# Patient Record
Sex: Male | Born: 1945 | Race: White | Hispanic: No | Marital: Married | Smoking: Never smoker
Health system: Southern US, Community
[De-identification: ages and names within clinical notes are randomized; demographics above are authoritative.]

## PROBLEM LIST (undated history)

## (undated) DIAGNOSIS — I10 Essential (primary) hypertension: Secondary | ICD-10-CM

---

## 2018-04-13 ENCOUNTER — Emergency Department (HOSPITAL_COMMUNITY)
Admission: EM | Admit: 2018-04-13 | Discharge: 2018-04-13 | Disposition: A | Discharge status: Home / Self Care | Payer: No Typology Code available for payment source | Attending: Emergency Medicine | Admitting: Emergency Medicine

## 2018-04-13 ENCOUNTER — Emergency Department (HOSPITAL_COMMUNITY): Payer: No Typology Code available for payment source

## 2018-04-13 ENCOUNTER — Encounter (HOSPITAL_COMMUNITY): Payer: Self-pay | Admitting: Emergency Medicine

## 2018-04-13 ENCOUNTER — Other Ambulatory Visit: Payer: Self-pay

## 2018-04-13 DIAGNOSIS — R0789 Other chest pain: Secondary | ICD-10-CM | POA: Diagnosis not present

## 2018-04-13 DIAGNOSIS — Z79899 Other long term (current) drug therapy: Secondary | ICD-10-CM | POA: Diagnosis not present

## 2018-04-13 DIAGNOSIS — I10 Essential (primary) hypertension: Secondary | ICD-10-CM | POA: Diagnosis not present

## 2018-04-13 DIAGNOSIS — R079 Chest pain, unspecified: Secondary | ICD-10-CM

## 2018-04-13 DIAGNOSIS — Z7982 Long term (current) use of aspirin: Secondary | ICD-10-CM | POA: Insufficient documentation

## 2018-04-13 DIAGNOSIS — M25512 Pain in left shoulder: Secondary | ICD-10-CM | POA: Diagnosis not present

## 2018-04-13 HISTORY — DX: Essential (primary) hypertension: I10

## 2018-04-13 LAB — CBC
HEMATOCRIT: 44.2 % (ref 39.0–52.0)
HEMOGLOBIN: 15.5 g/dL (ref 13.0–17.0)
MCH: 34.6 pg — AB (ref 26.0–34.0)
MCHC: 35.1 g/dL (ref 30.0–36.0)
MCV: 98.7 fL (ref 78.0–100.0)
Platelets: 307 10*3/uL (ref 150–400)
RBC: 4.48 MIL/uL (ref 4.22–5.81)
RDW: 13.9 % (ref 11.5–15.5)
WBC: 16.4 10*3/uL — ABNORMAL HIGH (ref 4.0–10.5)

## 2018-04-13 LAB — BASIC METABOLIC PANEL
ANION GAP: 8 (ref 5–15)
BUN: 12 mg/dL (ref 8–23)
CHLORIDE: 104 mmol/L (ref 98–111)
CO2: 26 mmol/L (ref 22–32)
Calcium: 9.9 mg/dL (ref 8.9–10.3)
Creatinine, Ser: 0.88 mg/dL (ref 0.61–1.24)
GFR calc non Af Amer: >60 mL/min (ref >60)
GLUCOSE: 121 mg/dL — AB (ref 70–99)
POTASSIUM: 3.6 mmol/L (ref 3.5–5.1)
Sodium: 138 mmol/L (ref 135–145)

## 2018-04-13 LAB — TROPONIN I

## 2018-04-13 MED ORDER — HYDROCODONE-ACETAMINOPHEN 5-325 MG PO TABS
1.0000 | ORAL_TABLET | Freq: Once | ORAL | Status: AC
  Administered 2018-04-13: 1 via ORAL
  Filled 2018-04-13: qty 1

## 2018-04-13 MED ORDER — HYDROCODONE-ACETAMINOPHEN 5-325 MG PO TABS: 1.0000 | ORAL_TABLET | Freq: Four times a day (QID) | ORAL | 0 refills | Status: AC | PRN

## 2018-04-13 NOTE — ED Triage Notes (Signed)
Chest pains for 5 days worse at night and goes from under his left arm to the center of his chest

## 2018-04-13 NOTE — ED Provider Notes (Signed)
Emergency Department Provider Note   I have reviewed the triage vital signs and the nursing notes.   HISTORY  Chief Complaint Chest Pain   HPI Mitchell Weber is a 72 y.o. male with history of hypertension presents the emergency department today secondary to chest pain.  Patient states he had about 5 to 6 days of progressively worsening chest pain that radiates from his left axilla to his left chest and left shoulder.  Is not worse with exertion.  Has no associated symptoms.  [place] seen in [place] Friday and was admitted for overnight observation where he had a CT scan was negative for PE, 2- troponins, and assuming a negative EKG as well.  He called his doctor to get a follow-up appointment as it seems to be worsening and is very persistent especially at night however they sent him to the emergency room instead.  Patient has no associated symptoms such as shortness of breath, nausea, vomiting, diarrhea, lightheadedness.  He will have very mild sweating intermittently at night but not consistently.  No lower extremity swelling.  No rashes.  No trauma.  Pain is not made worse or better with breathing or position changes. No other associated or modifying symptoms.    Past Medical History:  Diagnosis Date  . Hypertension     There are no active problems to display for this patient.   History reviewed. No pertinent surgical history.  Current Outpatient Rx  . Order #: [id number] Class: Historical Med  . Order #: [id number] Class: Historical Med  . Order #: [id number] Class: Historical Med  . Order #: [id number] Class: Historical Med  . Order #: [id number] Class: Historical Med  . Order #: [id number] Class: Historical Med  . Order #: [id number] Class: Historical Med  . Order #: [id number] Class: Historical Med  . Order #: [id number] Class: Print    Allergies Patient has no known allergies.  No family history on file.  Social History Social History   Tobacco  Use  . Smoking status: Not on file  Substance Use Topics  . Alcohol use: Not on file  . Drug use: Not on file    Review of Systems  All other systems negative except as documented in the HPI. All pertinent positives and negatives as reviewed in the HPI. ____________________________________________   PHYSICAL EXAM:  VITAL SIGNS: ED Triage Vitals  Enc Vitals Group     BP 04/13/18 0905 (!) 162/92     Pulse Rate 04/13/18 0905 82     Resp 04/13/18 0905 (!) 23     Temp 04/13/18 0905 97.9 F (36.6 C)     Temp Source 04/13/18 0905 Oral     SpO2 04/13/18 0905 98 %     Weight 04/13/18 0859 185 lb (83.9 kg)     Height 04/13/18 0859 5\' 7"  (1.702 m)    Constitutional: Alert and oriented. Well appearing and in no acute distress. Eyes: Conjunctivae are normal. PERRL. EOMI. Head: Atraumatic. Nose: No congestion/rhinnorhea. Mouth/Throat: Mucous membranes are moist.  Oropharynx non-erythematous. Neck: No stridor.  No meningeal signs.   Cardiovascular: Normal rate, regular rhythm. Good peripheral circulation. Grossly normal heart sounds.   Respiratory: Normal respiratory effort.  No retractions. Lungs CTAB. Gastrointestinal: Soft and nontender. No distention.  Musculoskeletal: No lower extremity tenderness nor edema. No gross deformities of extremities. Mild ttp in left lateral ribs but doesn't reproduce his pain. Neurologic:  Normal speech and language. Right facial droop. Skin:  Skin is warm, dry and intact. No rash noted.  ____________________________________________   LABS (all labs ordered are listed, but only abnormal results are displayed)  Labs Reviewed  BASIC METABOLIC PANEL - Abnormal; Notable for the following components:      Result Value   Glucose, Bld 121 (*)    All other components within normal limits  CBC - Abnormal; Notable for the following components:   WBC 16.4 (*)    MCH 34.6 (*)    All other components within normal limits  TROPONIN I    ____________________________________________  EKG   EKG Interpretation  Date/Time:  Tuesday April 13 2018 08:58:11 EDT Ventricular Rate:  81 PR Interval:    QRS Duration: 85 QT Interval:  376 QTC Calculation: 437 R Axis:   -17 Text Interpretation:  Sinus rhythm Borderline left axis deviation No old tracing to compare Confirmed by Marily Memos [phone]) on 04/13/2018 9:10:34 AM       ____________________________________________  RADIOLOGY  Dg Chest 2 View  Result Date: 04/13/2018 CLINICAL DATA:  Constant chest pain for the past 5-6 days. History of hypertension. EXAM: CHEST - 2 VIEW COMPARISON:  None in PACs FINDINGS: The lungs are adequately inflated. There is no focal infiltrate. There is no pleural effusion. The heart and pulmonary vascularity are normal. The mediastinum is normal in width. There is calcification in the wall of the aortic arch. 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 IMPRESSION: There is no acute cardiopulmonary abnormality. Thoracic aortic atherosclerosis. Electronically Signed   By: David  [place] M.D.   On: 04/13/2018 09:39    ____________________________________________   PROCEDURES  Procedure(s) performed:   Procedures   ____________________________________________   INITIAL IMPRESSION / ASSESSMENT AND PLAN / ED COURSE  Unclear etiology for the patient's pain.  Will give a dose of pain medicine and talk to cardiology.  EKG, troponin and chest x-ray here are within normal limits.  His white blood cell counts a little bit elevated of which I am not sure what to make of it however I do not proceed or be any emergent causes for symptoms.  Will disposition based on cardiology recommendations.   [facility] but is more than willing to see them if they want to.  After discussion with the patient he will [facility] to [facility]     Pertinent labs & imaging results that were available during my care of the patient were reviewed by me and considered in my medical decision making (see chart for details).  ____________________________________________  FINAL CLINICAL IMPRESSION(S) / ED DIAGNOSES  Final diagnoses:  Nonspecific chest pain     MEDICATIONS GIVEN DURING THIS VISIT:  Medications  HYDROcodone-acetaminophen (NORCO/VICODIN) 5-325 MG per tablet 1 tablet (1 tablet Oral Given 04/13/18 0956)     NEW OUTPATIENT MEDICATIONS STARTED DURING THIS VISIT:  Discharge Medication List as of 04/13/2018 12:15 PM    START taking these medications   Details  HYDROcodone-acetaminophen (NORCO/VICODIN) 5-325 MG tablet Take 1-2 tablets by mouth every 6 (six) hours as needed., Starting Tue 04/13/2018, Print        Note:  This note was prepared with assistance of Dragon voice recognition software. Occasional wrong-word or sound-a-like substitutions may have occurred due to the inherent limitations of voice recognition software.   Marily Memos, MD 04/13/18 [phone]

## 2019-03-04 IMAGING — DX DG CHEST 2V
2 series · 2 of 2 positions shown · non-contrast
Comparison: None in PACs

CLINICAL DATA: Constant chest pain for the past 5-6 days. History
of hypertension.

EXAM:
CHEST - 2 VIEW

[chest pa]
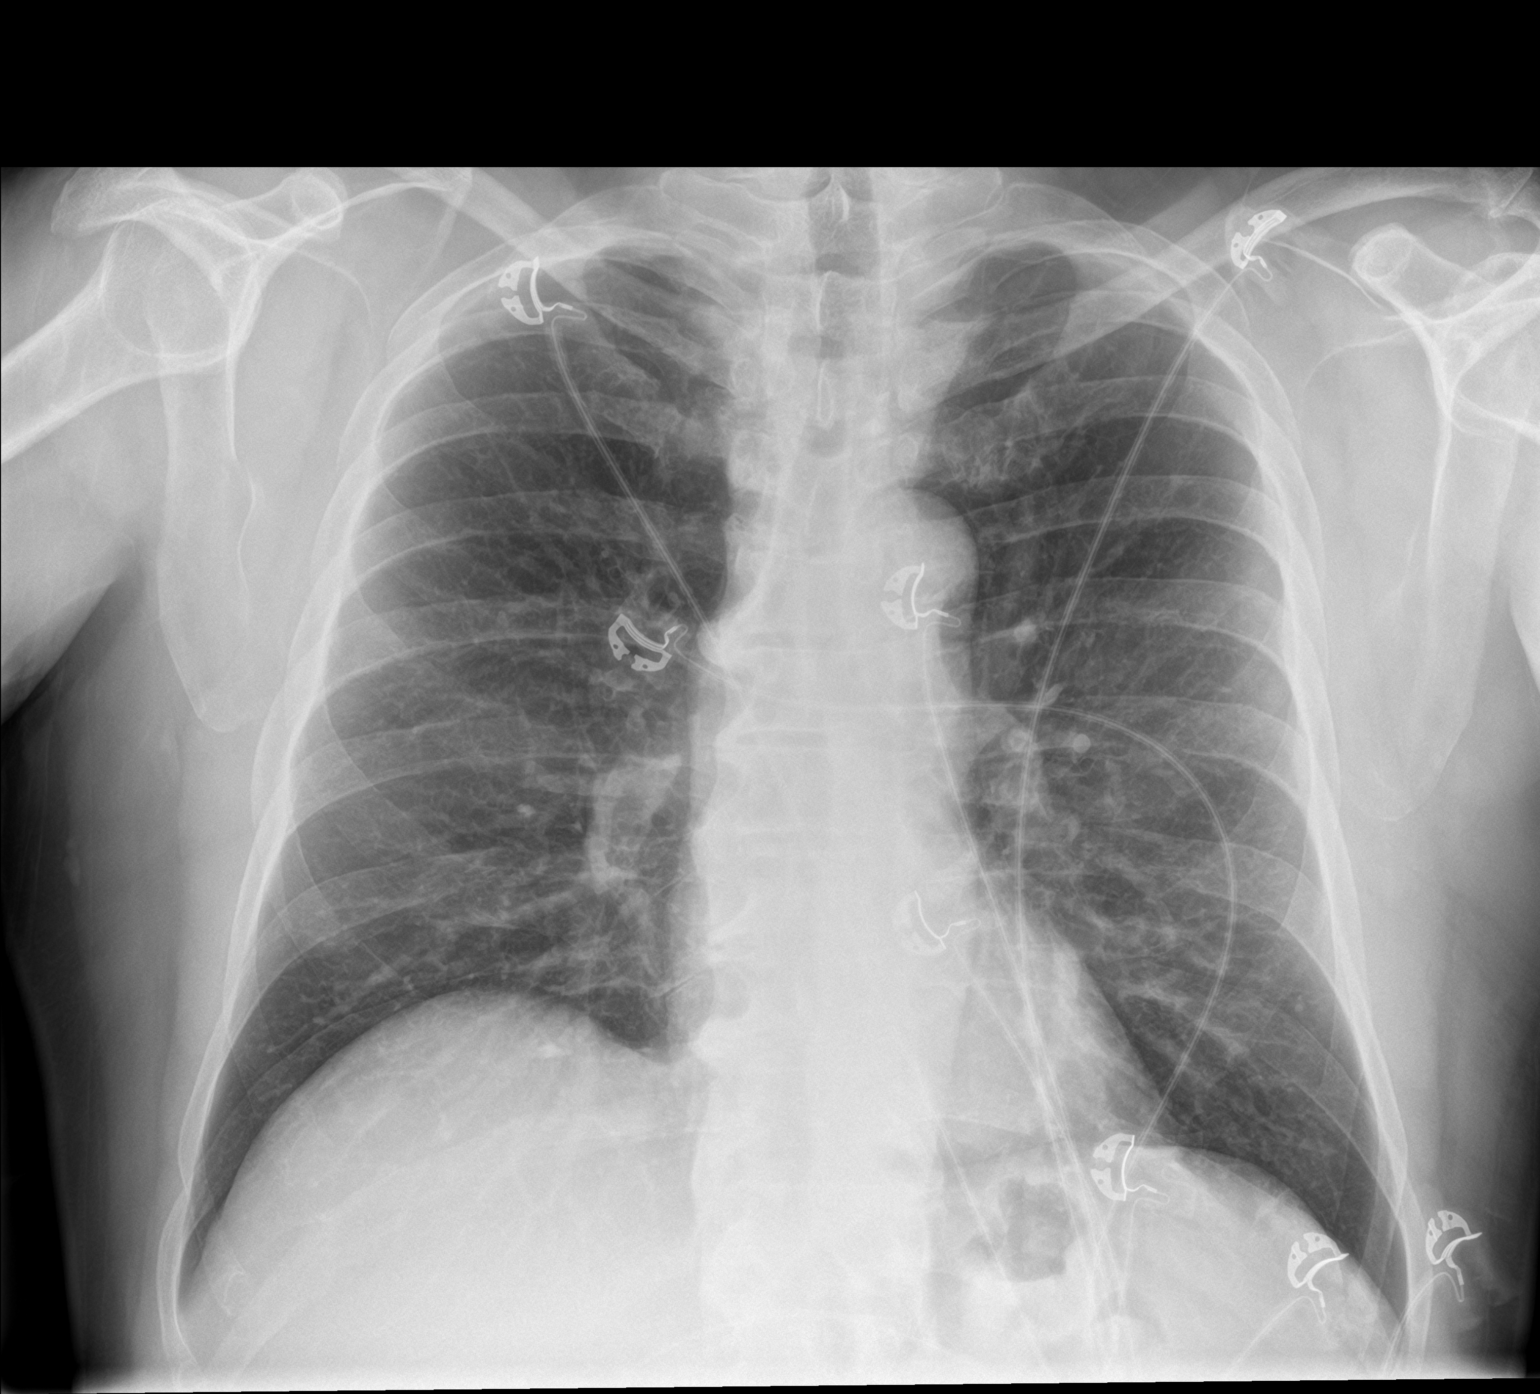

[chest lat]
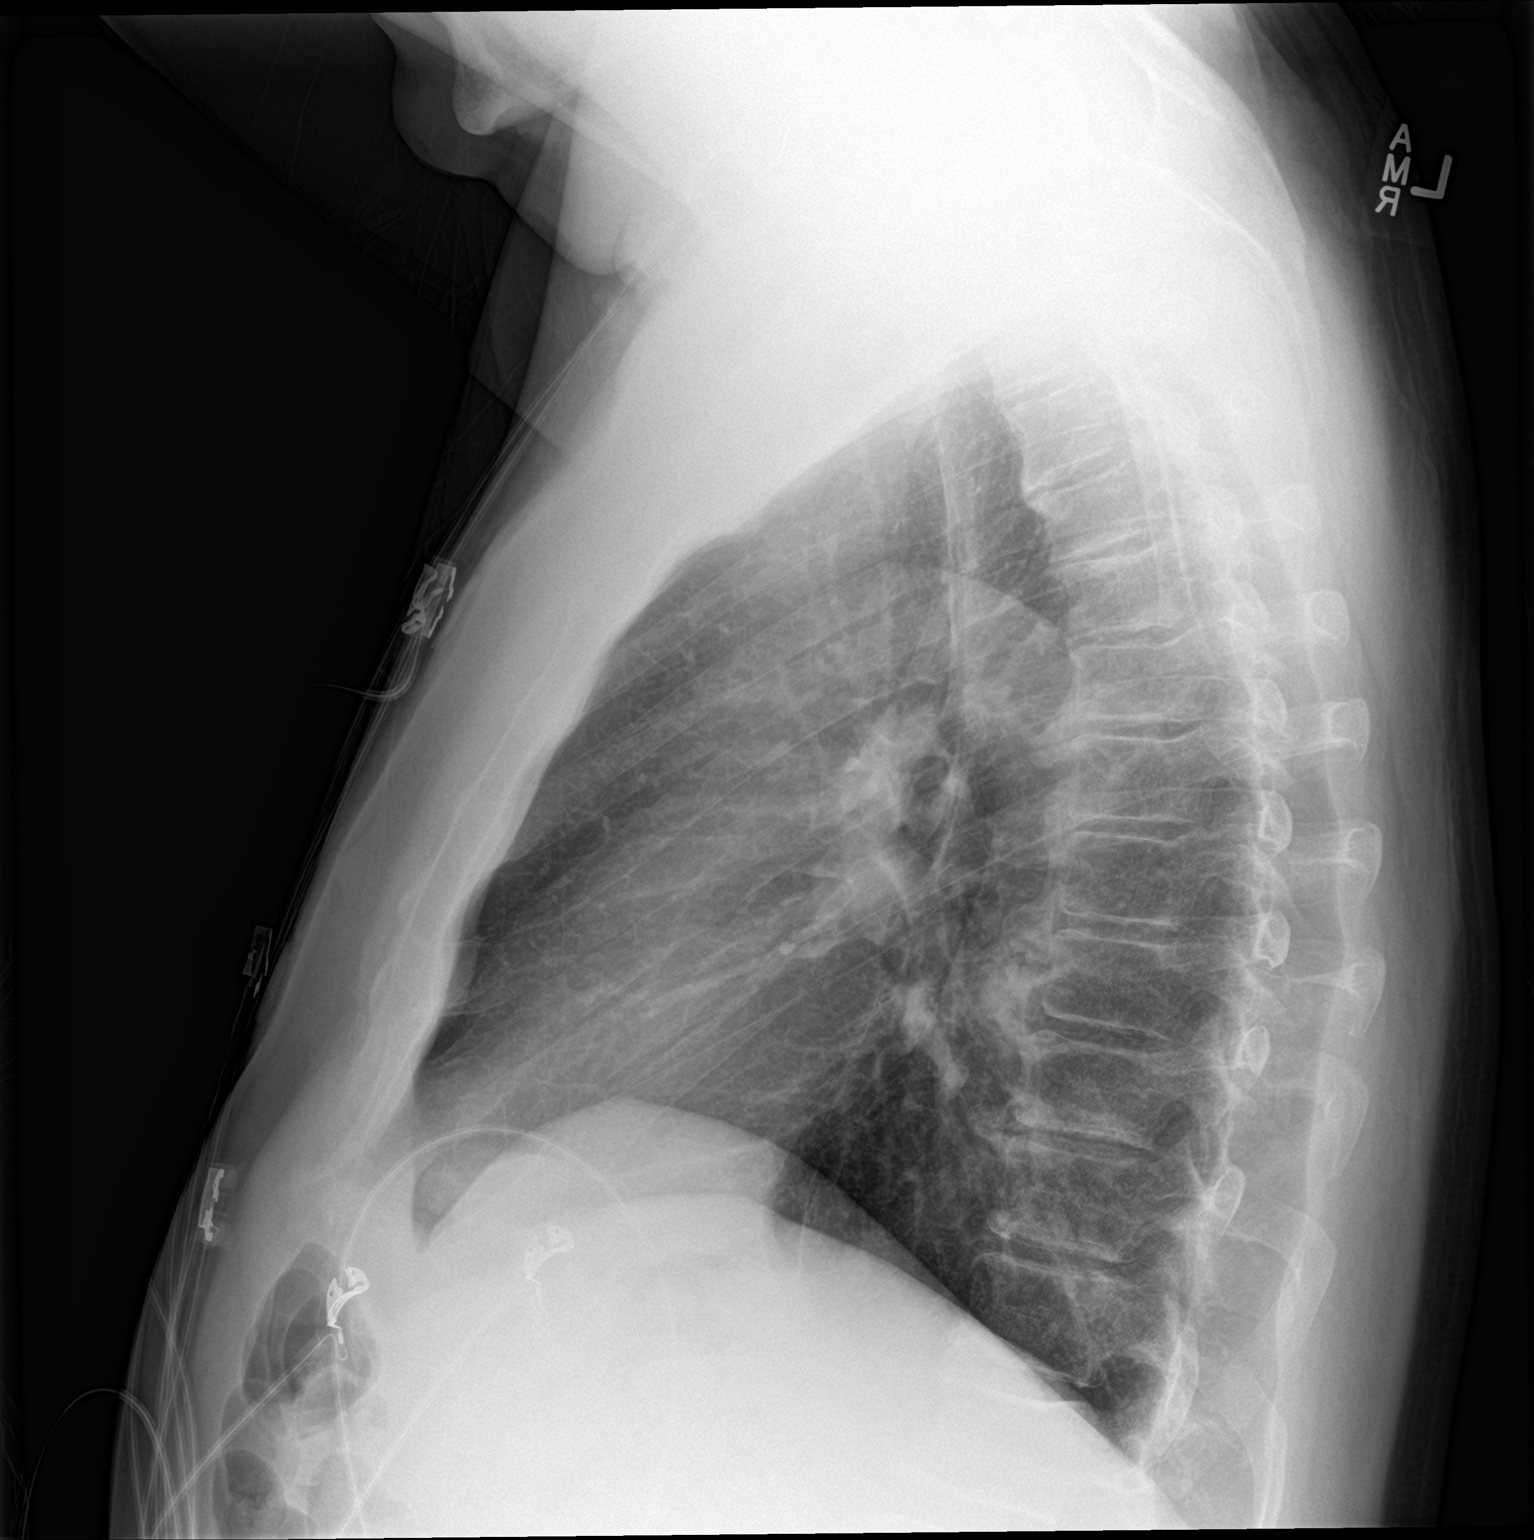

[2 of 2 positions shown; findings below may reference images not displayed]

FINDINGS: The lungs are adequately inflated. There is no focal infiltrate.
There is no pleural effusion. The heart and pulmonary vascularity
are normal. The mediastinum is normal in width. There is
calcification in the wall of the aortic arch. There is mild
multilevel degenerative disc disease of the thoracic spine.
IMPRESSION: There is no acute cardiopulmonary abnormality.

Thoracic aortic atherosclerosis.

## 2021-09-26 ENCOUNTER — Other Ambulatory Visit: Payer: Self-pay

## 2021-09-26 ENCOUNTER — Ambulatory Visit
Admission: EM | Admit: 2021-09-26 | Discharge: 2021-09-26 | Disposition: A | Discharge status: Home / Self Care | Payer: No Typology Code available for payment source | Attending: Family Medicine | Admitting: Family Medicine

## 2021-09-26 ENCOUNTER — Encounter: Payer: Self-pay | Admitting: Emergency Medicine

## 2021-09-26 DIAGNOSIS — N390 Urinary tract infection, site not specified: Secondary | ICD-10-CM | POA: Insufficient documentation

## 2021-09-26 LAB — POCT URINALYSIS DIP (MANUAL ENTRY)
Bilirubin, UA: NEGATIVE
Glucose, UA: NEGATIVE mg/dL
Ketones, POC UA: NEGATIVE mg/dL
Nitrite, UA: NEGATIVE
Protein Ur, POC: 30 mg/dL — AB
Spec Grav, UA: 1.025 (ref 1.010–1.025)
Urobilinogen, UA: 0.2 E.U./dL
pH, UA: 6 (ref 5.0–8.0)

## 2021-09-26 MED ORDER — SULFAMETHOXAZOLE-TRIMETHOPRIM 800-160 MG PO TABS: 1.0000 | ORAL_TABLET | Freq: Two times a day (BID) | ORAL | 0 refills | Status: AC

## 2021-09-26 NOTE — ED Triage Notes (Signed)
Pt presents with dysuria and urinary frequency sxs started today.

## 2021-09-29 LAB — URINE CULTURE: Culture: NO GROWTH

## 2021-09-30 NOTE — ED Provider Notes (Signed)
[facility]    CSN: [id number] Arrival date & time: 09/26/21  1907      History   Chief Complaint Chief Complaint  Patient presents with   Dysuria   Urinary Frequency    HPI Mitchell Weber is a 76 y.o. male.   Patient presenting today with 1 day history of urinary frequency, dysuria, hematuria.  Denies abdominal pain, nausea, vomiting, diarrhea, fevers.  Has had urinary tract infections in the past that presented similarly.  Not trying anything over-the-counter for symptoms as far.   Past Medical History:  Diagnosis Date   Hypertension     There are no problems to display for this patient.   History reviewed. No pertinent surgical history.     Home Medications    Prior to Admission medications   Medication Sig Start Date End Date Taking? Authorizing Provider  allopurinol (ZYLOPRIM) 300 MG tablet Take 300 mg by mouth daily.   Yes [provider]  amLODipine (NORVASC) 10 MG tablet TAKE ONE TABLET BY MOUTH DAILY (HOLD DOSE FOR SYSTOLIC BLOOD PRESSURE  LESS THAN A 100 *AVOID GRAPEFRUIT OR ITS JUICE WITH THIS MED) 05/07/21  Yes [provider]  cholecalciferol (VITAMIN D) 1000 units tablet Take 1,000 Units by mouth daily.   Yes [provider]  gabapentin (NEURONTIN) 300 MG capsule TAKE ONE CAPSULE BY MOUTH ONCE A DAY FOR PAIN 08/30/21  Yes [provider]  lisinopril (PRINIVIL,ZESTRIL) 20 MG tablet Take 20 mg by mouth daily.   Yes [provider]  Multiple Vitamin (MULTIVITAMIN) tablet Take 1 tablet by mouth daily.   Yes [provider]  sulfamethoxazole-trimethoprim (BACTRIM DS) 800-160 MG tablet Take 1 tablet by mouth 2 (two) times daily for 7 days. 09/26/21 10/03/21 Yes Volney American, PA-C  acetaminophen (TYLENOL) 500 MG tablet Take by mouth every 6 (six) hours as needed for mild pain or headache.    [provider]  aspirin EC 81 MG tablet Take 81 mg by mouth daily.    [provider]  HYDROcodone-acetaminophen (NORCO/VICODIN) 5-325 MG tablet Take 1-2 tablets by mouth every 6 (six) hours as needed. 04/13/18   Mesner, Corene Cornea, MD  Omega-3 Fatty Acids (FISH OIL) 1200 MG CAPS Take 1,200 mg by mouth daily.    [provider]    Family History History reviewed. No pertinent family history.  Social History Social History   Tobacco Use   Smoking status: Never   Smokeless tobacco: Never  Vaping Use   Vaping Use: Never used  Substance Use Topics   Alcohol use: Not Currently   Drug use: Never     Allergies   Atorvastatin   Review of Systems Review of Systems Per HPI  Physical Exam Triage Vital Signs ED Triage Vitals  Enc Vitals Group     BP 09/26/21 1940 (!) 170/73     Pulse Rate 09/26/21 1940 78     Resp 09/26/21 1940 16     Temp 09/26/21 1940 98.1 F (36.7 C)     Temp Source 09/26/21 1940 Oral     SpO2 09/26/21 1940 96 %     Weight --      Height --      Head Circumference --      Peak Flow --      Pain Score 09/26/21 1945 5     Pain Loc --      Pain Edu? --      Excl. in [place]? --  No data found.  Updated Vital Signs BP (!) 170/73 (BP Location: Right Arm)    Pulse 78    Temp 98.1 F (36.7 C) (Oral)    Resp 16    SpO2 96%   Visual Acuity Right Eye Distance:   Left Eye Distance:   Bilateral Distance:    Right Eye Near:   Left Eye Near:    Bilateral Near:     Physical Exam Vitals and nursing note reviewed.  Constitutional:      Appearance: Normal appearance.  HENT:     Head: Atraumatic.  Eyes:     Extraocular Movements: Extraocular movements intact.     Conjunctiva/sclera: Conjunctivae normal.  Cardiovascular:     Rate and Rhythm: Normal rate and regular rhythm.  Pulmonary:     Effort: Pulmonary effort is normal.     Breath sounds: Normal breath sounds.  Abdominal:     General: Bowel sounds are normal. There is no distension.     Palpations: Abdomen is soft.     Tenderness: There is no abdominal tenderness. There is  no guarding.  Musculoskeletal:        General: Normal range of motion.     Cervical back: Normal range of motion and neck supple.  Skin:    General: Skin is warm and dry.  Neurological:     General: No focal deficit present.     Mental Status: He is oriented to person, place, and time.  Psychiatric:        Mood and Affect: Mood normal.        Thought Content: Thought content normal.        Judgment: Judgment normal.     UC Treatments / Results  Labs (all labs ordered are listed, but only abnormal results are displayed) Labs Reviewed  POCT URINALYSIS DIP (MANUAL ENTRY) - Abnormal; Notable for the following components:      Result Value   Color, UA orange (*)    Blood, UA large (*)    Protein Ur, POC =30 (*)    Leukocytes, UA Trace (*)    All other components within normal limits  URINE CULTURE    EKG   Radiology No results found.  Procedures Procedures (including critical care time)  Medications Ordered in UC Medications - No data to display  Initial Impression / Assessment and Plan / UC Course  I have reviewed the triage vital signs and the nursing notes.  Pertinent labs & imaging results that were available during my care of the patient were reviewed by me and considered in my medical decision making (see chart for details).     Urinalysis indicative of a probable urinary tract infection.  We will treat with Bactrim and await urine culture for further information.  Discussed to push fluids, empty bladder fully and frequently.  Return for acutely worsening symptoms at any time.  Final Clinical Impressions(s) / UC Diagnoses   Final diagnoses:  Acute lower UTI   Discharge Instructions   None    ED Prescriptions     Medication Sig Dispense Auth. Provider   sulfamethoxazole-trimethoprim (BACTRIM DS) 800-160 MG tablet Take 1 tablet by mouth 2 (two) times daily for 7 days. 14 tablet Volney American, [place]      PDMP not reviewed this encounter.    Volney American, [place] 09/30/21 1939

## 2022-01-28 ENCOUNTER — Ambulatory Visit
Admission: EM | Admit: 2022-01-28 | Discharge: 2022-01-28 | Disposition: A | Discharge status: Home / Self Care | Payer: Non-veteran care | Attending: Nurse Practitioner | Admitting: Nurse Practitioner

## 2022-01-28 DIAGNOSIS — U071 COVID-19: Secondary | ICD-10-CM

## 2022-01-28 DIAGNOSIS — R52 Pain, unspecified: Secondary | ICD-10-CM | POA: Diagnosis not present

## 2022-01-28 MED ORDER — PROMETHAZINE-DM 6.25-15 MG/5ML PO SYRP: 5.0000 mL | ORAL_SOLUTION | Freq: Four times a day (QID) | ORAL | 0 refills | Status: AC | PRN

## 2022-01-28 MED ORDER — MOLNUPIRAVIR EUA 200MG CAPSULE: 4.0000 | ORAL_CAPSULE | Freq: Two times a day (BID) | ORAL | 0 refills | Status: AC

## 2022-01-28 NOTE — ED Provider Notes (Signed)
?[facility] ? ? ? ?CSN: [id number] ?Arrival date & time: 01/28/22  1049 ? ? ?  ? ?History   ?Chief Complaint ?Chief Complaint  ?Patient presents with  ? Generalized Body Aches  ?  + COVID  ? Fever  ? Nasal Congestion  ?   ?  ? ? ?HPI ?Mitchell Weber is a 76 y.o. male.  ? ?Patient is a 76 year old male who presents with generalized body aches, fever, and nasal congestion.  Symptoms started earlier today.  Patient states he took a home COVID test which was positive today.  States that he was at a wedding 4 days ago and now 5 of the attendants have COVID.  He denies headache, shortness of breath, difficulty breathing, or GI symptoms.  Patient states he has had 3 COVID vaccines.  Denies any other symptoms at present. ? ?The history is provided by the patient.  ? ?Past Medical History:  ?Diagnosis Date  ? Hypertension   ? ? ?There are no problems to display for this patient. ? ? ?History reviewed. No pertinent surgical history. ? ? ? ? ?Home Medications   ? ?Prior to Admission medications   ?Medication Sig Start Date End Date Taking? Authorizing Provider  ?acetaminophen (TYLENOL) 500 MG tablet Take by mouth every 6 (six) hours as needed for mild pain or headache.    [provider]  ?allopurinol (ZYLOPRIM) 300 MG tablet Take 300 mg by mouth daily.    [provider]  ?amLODipine (NORVASC) 10 MG tablet TAKE ONE TABLET BY MOUTH DAILY (HOLD DOSE FOR SYSTOLIC BLOOD PRESSURE  LESS THAN A 100 *AVOID GRAPEFRUIT OR ITS JUICE WITH THIS MED) 05/07/21   [provider]  ?aspirin EC 81 MG tablet Take 81 mg by mouth daily.    [provider]  ?cholecalciferol (VITAMIN D) 1000 units tablet Take 1,000 Units by mouth daily.    [provider]  ?gabapentin (NEURONTIN) 300 MG capsule TAKE ONE CAPSULE BY MOUTH ONCE A DAY FOR PAIN 08/30/21   [provider]  ?HYDROcodone-acetaminophen (NORCO/VICODIN) 5-325 MG tablet Take 1-2 tablets by mouth every 6 (six) hours as needed.  04/13/18   Mesner, Barbara Cower, MD  ?lisinopril (PRINIVIL,ZESTRIL) 20 MG tablet Take 20 mg by mouth daily.    [provider]  ?Multiple Vitamin (MULTIVITAMIN) tablet Take 1 tablet by mouth daily.    [provider]  ?Omega-3 Fatty Acids (FISH OIL) 1200 MG CAPS Take 1,200 mg by mouth daily.    [provider]  ? ? ?Family History ?History reviewed. No pertinent family history. ? ?Social History ?Social History  ? ?Tobacco Use  ? Smoking status: Never  ? Smokeless tobacco: Never  ?Vaping Use  ? Vaping Use: Never used  ?Substance Use Topics  ? Alcohol use: Not Currently  ? Drug use: Never  ? ? ? ?Allergies   ?Atorvastatin ? ? ?Review of Systems ?Review of Systems  ?Constitutional:  Positive for activity change, fatigue and fever.  ?HENT:  Positive for congestion. Negative for sore throat.   ?Eyes: Negative.   ?Respiratory:  Positive for cough. Negative for shortness of breath and wheezing.   ?Cardiovascular: Negative.   ?Gastrointestinal: Negative.   ?Skin: Negative.   ?Psychiatric/Behavioral: Negative.    ? ? ?Physical Exam ?Triage Vital Signs ?ED Triage Vitals  ?Enc Vitals Group  ?   BP 01/28/22 1201 128/84  ?   Pulse Rate 01/28/22 1201 72  ?   Resp 01/28/22 1201 18  ?  Temp 01/28/22 1201 99.6 ?F (37.6 ?C)  ?   Temp Source 01/28/22 1201 Oral  ?   SpO2 01/28/22 1201 97 %  ?   Weight --   ?   Height --   ?   Head Circumference --   ?   Peak Flow --   ?   Pain Score 01/28/22 1200 0  ?   Pain Loc --   ?   Pain Edu? --   ?   Excl. in [place]? --   ? ?No data found. ? ?Updated Vital Signs ?BP 128/84 (BP Location: Right Arm)   Pulse 72   Temp 99.6 ?F (37.6 ?C) (Oral)   Resp 18   SpO2 97%  ? ?Visual Acuity ?Right Eye Distance:   ?Left Eye Distance:   ?Bilateral Distance:   ? ?Right Eye Near:   ?Left Eye Near:    ?Bilateral Near:    ? ?Physical Exam ?Vitals and nursing note reviewed.  ?Constitutional:   ?   Appearance: Normal appearance.  ?HENT:  ?   Head: Normocephalic and atraumatic.  ?   Right Ear:  Tympanic membrane, ear canal and external ear normal.  ?   Left Ear: Tympanic membrane, ear canal and external ear normal.  ?   Nose: Congestion present.  ?   Mouth/Throat:  ?   Mouth: Mucous membranes are moist.  ?   Pharynx: No oropharyngeal exudate or posterior oropharyngeal erythema.  ?Eyes:  ?   Extraocular Movements: Extraocular movements intact.  ?   Conjunctiva/sclera: Conjunctivae normal.  ?   Pupils: Pupils are equal, round, and reactive to light.  ?Cardiovascular:  ?   Rate and Rhythm: Normal rate and regular rhythm.  ?   Pulses: Normal pulses.  ?   Heart sounds: Normal heart sounds.  ?Pulmonary:  ?   Effort: Pulmonary effort is normal. No respiratory distress.  ?   Breath sounds: Normal breath sounds. No wheezing or rales.  ?Abdominal:  ?   General: Bowel sounds are normal.  ?   Palpations: Abdomen is soft.  ?Musculoskeletal:  ?   Cervical back: Normal range of motion.  ?Skin: ?   General: Skin is warm and dry.  ?   Capillary Refill: Capillary refill takes less than 2 seconds.  ?Neurological:  ?   General: No focal deficit present.  ?   Mental Status: He is alert and oriented to person, place, and time.  ?Psychiatric:     ?   Mood and Affect: Mood normal.  ? ? ? ?UC Treatments / Results  ?Labs ?(all labs ordered are listed, but only abnormal results are displayed) ?Labs Reviewed  ?NOVEL CORONAVIRUS, NAA  ? ? ?EKG ? ? ?Radiology ?No results found. ? ?Procedures ?Procedures (including critical care time) ? ?Medications Ordered in UC ?Medications - No data to display ? ?Initial Impression / Assessment and Plan / UC Course  ?I have reviewed the triage vital signs and the nursing notes. ? ?Pertinent labs & imaging results that were available during my care of the patient were reviewed by me and considered in my medical decision making (see chart for details). ? ?The patient is a 76 year old male who presents with a positive home COVID test.  He complains of fever, nasal congestion, and malaise.  Patient's  symptoms started today.  States that he was at a wedding 4 days ago and 5 of the attendants have now tested positive for COVID including him.  He took a home COVID  test this morning.  He has not had recent lab work in our system, therefore, will prescribe the patient molnupiravir for his symptoms.  Patient advised to continue supportive care to include recent fluids and getting plenty of rest.  Patient advised to continue Tylenol.  Also prescribed Promethazine DM to help with his cough.  Patient advised to follow-up with his PCP in the next 7 to 10 days for follow-up.  Patient given strict precautions to go to the ER to include worsening shortness of breath, difficulty breathing, or other concerns. ?Final Clinical Impressions(s) / UC Diagnoses  ? ?Final diagnoses:  ?Body aches  ? ?Discharge Instructions   ?None ?  ? ?ED Prescriptions   ?None ?  ? ?PDMP not reviewed this encounter. ?  ?Abran Cantor, NP ?01/28/22 1245 ? ?

## 2022-01-28 NOTE — ED Triage Notes (Signed)
Pt reports body aches, lightheaded, fever, congestion since this morning.  ? ?Pt had a positive home COVID test today.  ?Pt needs a COVID test to have meds for COVID from the [place].  ?

## 2022-01-28 NOTE — Discharge Instructions (Addendum)
Take medications as prescribed. ?Continue Tylenol for fever.  Recommend tablets every 8 hours. ?Increase fluids and get plenty of rest. ?Follow-up in the ER immediately if you develop worsening shortness of breath, difficulty breathing, or other concerns. ?Follow-up with your primary care within the next 7 to 10 days for follow-up. ?

## 2022-01-29 LAB — NOVEL CORONAVIRUS, NAA: SARS-CoV-2, NAA: DETECTED — AB
